# Patient Record
Sex: Male | Born: 1975 | Hispanic: Yes | Marital: Single
Health system: Southern US, Community
[De-identification: ages and names within clinical notes are randomized; demographics above are authoritative.]

---

## 2013-11-01 ENCOUNTER — Emergency Department (HOSPITAL_COMMUNITY): Payer: Self-pay

## 2013-11-01 ENCOUNTER — Emergency Department (HOSPITAL_COMMUNITY)
Admission: EM | Admit: 2013-11-01 | Discharge: 2013-11-01 | Disposition: A | Discharge status: Home / Self Care | Payer: Self-pay | Attending: Emergency Medicine | Admitting: Emergency Medicine

## 2013-11-01 ENCOUNTER — Encounter (HOSPITAL_COMMUNITY): Payer: Self-pay | Admitting: Emergency Medicine

## 2013-11-01 DIAGNOSIS — M549 Dorsalgia, unspecified: Secondary | ICD-10-CM

## 2013-11-01 DIAGNOSIS — R9431 Abnormal electrocardiogram [ECG] [EKG]: Secondary | ICD-10-CM | POA: Insufficient documentation

## 2013-11-01 DIAGNOSIS — M436 Torticollis: Secondary | ICD-10-CM | POA: Insufficient documentation

## 2013-11-01 DIAGNOSIS — R52 Pain, unspecified: Secondary | ICD-10-CM | POA: Insufficient documentation

## 2013-11-01 DIAGNOSIS — M546 Pain in thoracic spine: Secondary | ICD-10-CM | POA: Insufficient documentation

## 2013-11-01 DIAGNOSIS — R079 Chest pain, unspecified: Secondary | ICD-10-CM | POA: Insufficient documentation

## 2013-11-01 DIAGNOSIS — M542 Cervicalgia: Secondary | ICD-10-CM | POA: Insufficient documentation

## 2013-11-01 LAB — CBC
HCT: 44.1 % (ref 39.0–52.0)
HEMOGLOBIN: 15.6 g/dL (ref 13.0–17.0)
MCH: 30.8 pg (ref 26.0–34.0)
MCHC: 35.4 g/dL (ref 30.0–36.0)
MCV: 87.2 fL (ref 78.0–100.0)
Platelets: 176 10*3/uL (ref 150–400)
RBC: 5.06 MIL/uL (ref 4.22–5.81)
RDW: 12.3 % (ref 11.5–15.5)
WBC: 7.3 10*3/uL (ref 4.0–10.5)

## 2013-11-01 LAB — BASIC METABOLIC PANEL
BUN: 15 mg/dL (ref 6–23)
CALCIUM: 9.3 mg/dL (ref 8.4–10.5)
CO2: 21 meq/L (ref 19–32)
CREATININE: 0.81 mg/dL (ref 0.50–1.35)
Chloride: 104 mEq/L (ref 96–112)
GFR calc Af Amer: >90 mL/min (ref >90)
GLUCOSE: 121 mg/dL — AB (ref 70–99)
Potassium: 4.5 mEq/L (ref 3.7–5.3)
Sodium: 140 mEq/L (ref 137–147)

## 2013-11-01 LAB — TROPONIN I: Troponin I: <0.3 ng/mL (ref <0.30)

## 2013-11-01 LAB — I-STAT TROPONIN, ED: Troponin i, poc: 0 ng/mL (ref 0.00–0.08)

## 2013-11-01 LAB — PRO B NATRIURETIC PEPTIDE: Pro B Natriuretic peptide (BNP): 86.4 pg/mL (ref 0–125)

## 2013-11-01 MED ORDER — IBUPROFEN 800 MG PO TABS: 800.0000 mg | ORAL_TABLET | Freq: Three times a day (TID) | ORAL | Status: AC

## 2013-11-01 MED ORDER — HYDROCODONE-ACETAMINOPHEN 5-325 MG PO TABS: 1.0000 | ORAL_TABLET | ORAL | Status: AC | PRN

## 2013-11-01 MED ORDER — OXYCODONE-ACETAMINOPHEN 5-325 MG PO TABS
1.0000 | ORAL_TABLET | Freq: Once | ORAL | Status: AC
  Administered 2013-11-01: 1 via ORAL
  Filled 2013-11-01: qty 1

## 2013-11-01 MED ORDER — CYCLOBENZAPRINE HCL 10 MG PO TABS: 10.0000 mg | ORAL_TABLET | Freq: Two times a day (BID) | ORAL | Status: AC | PRN

## 2013-11-01 NOTE — ED Notes (Addendum)
Wife is present interpreting for pt.  Pt c/o sharp pain in his upper back, between the scapulae.  Pain started today around 1pm while the patient was walking and reports feeling like the "wind was knocked out of him".  Pt reports its a constant sharp pain, exacerbated by back/neck movement.  Pt denies heavy lifting or strenuous activity before onset of pain.  Pt denies any injury or trauma.  Pt reported pain radiated to chest in triage, but no longer is experiencing any chest pain.  Pt reports taking ibuprofen at 1pm which helped the pain.  No acute distress noted.  Skin warm and dry.  Respirations even and unlabored.

## 2013-11-01 NOTE — ED Notes (Signed)
Pt reports walking and sudden onset of upper back pain that will intermittently radiate into chest and pt will feel sob. Denies any injury or hx of back pain. Ambulatory at triage with no acute distress.

## 2013-11-01 NOTE — ED Provider Notes (Signed)
CSN: [id number]     Arrival date & time 11/01/13  1426 History   First MD Initiated Contact with Patient 11/01/13 1557     Chief Complaint  Patient presents with  . Back Pain  . Chest Pain     (Consider location/radiation/quality/duration/timing/severity/associated sxs/prior Treatment) HPI Comments: Stuart SchleinDavid Enriquez Jones is a 38 y.o. male without a significant past medical history, presenting the Emergency Department with a chief complaint of abrupt onset of neck and back pain.  The patient reports while walking he experienced a sharp pain to his upper back and neck.  He reports the pain was so severe that it crippled him, making him fall to the ground.  He reports he has never had similar pain in the past.  He reports taking 400 mg of Ibuprofen and ASA.  He reports partial relief of symptoms.  Aggravating factors included flexion of neck, movement of bilateral upper extremities.   NO PCP   Patient is a 38 y.o. male presenting with back pain and chest pain. The history is provided by the patient and the spouse. A language interpreter was used (family.  Mother and wife.).  Back Pain Associated symptoms: no chest pain, no fever and no headaches   Chest Pain Associated symptoms: back pain   Associated symptoms: no cough, no diaphoresis, no fever, no headache, no palpitations and no shortness of breath     History reviewed. No pertinent past medical history. History reviewed. No pertinent past surgical history. History reviewed. No pertinent family history. History  Substance Use Topics  . Smoking status: Not on file  . Smokeless tobacco: Not on file  . Alcohol Use: No    Review of Systems  Constitutional: Negative for fever, chills and diaphoresis.  Respiratory: Negative for cough, chest tightness, shortness of breath and wheezing.   Cardiovascular: Negative for chest pain, palpitations and leg swelling.  Musculoskeletal: Positive for back pain, neck pain and neck stiffness.  Skin:  Negative for rash and wound.  Neurological: Negative for light-headedness and headaches.      Allergies  Review of patient's allergies indicates no known allergies.  Home Medications   Current Outpatient Rx  Name  Route  Sig  Dispense  Refill  . ibuprofen (ADVIL,MOTRIN) 200 MG tablet   Oral   Take 400 mg by mouth every 6 (six) hours as needed for moderate pain.         . cyclobenzaprine (FLEXERIL) 10 MG tablet   Oral   Take 1 tablet (10 mg total) by mouth 2 (two) times daily as needed for muscle spasms.   10 tablet   0   . HYDROcodone-acetaminophen (NORCO/VICODIN) 5-325 MG per tablet   Oral   Take 1 tablet by mouth every 4 (four) hours as needed.   10 tablet   0   . ibuprofen (ADVIL,MOTRIN) 800 MG tablet   Oral   Take 1 tablet (800 mg total) by mouth 3 (three) times daily.   30 tablet   0    BP 128/91  Pulse 53  Temp(Src) 97.5 F (36.4 C) (Oral)  Resp 16  Wt 220 lb 3 oz (99.876 kg)  SpO2 96% Physical Exam  Nursing note and vitals reviewed. Constitutional: He is oriented to person, place, and time. He appears well-developed and well-nourished. No distress.  HENT:  Head: Normocephalic and atraumatic.  Neck: Normal range of motion. Neck supple. Spinous process tenderness and muscular tenderness present.  Full active ROM of C-spine.  Cardiovascular: Normal rate and  regular rhythm.   Pulses:      Radial pulses are 2+ on the right side, and 2+ on the left side.  Pulmonary/Chest: Not tachypneic. He has no decreased breath sounds. He has no wheezes. He has no rhonchi. He has no rales.  Abdominal: Soft. There is no tenderness. There is no rebound and no guarding.  Musculoskeletal:       Right shoulder: He exhibits normal range of motion.       Left shoulder: He exhibits normal range of motion.       Cervical back: He exhibits tenderness, bony tenderness and spasm. He exhibits normal range of motion.       Back:  Mild mid line tenderness to palpation of the  C-spine and T-spine.  Increase to paravertebral soft tissue.  Discomfort recreated with palpation and movement of right and left upper extremitites.  Lymphadenopathy:       Head (right side): No submental, no submandibular, no tonsillar, no preauricular, no posterior auricular and no occipital adenopathy present.       Head (left side): No submental, no submandibular, no tonsillar, no preauricular, no posterior auricular and no occipital adenopathy present.    He has no cervical adenopathy.  Neurological: He is alert and oriented to person, place, and time. No sensory deficit. He exhibits normal muscle tone. Coordination normal.  Reflex Scores:      Bicep reflexes are 1+ on the right side and 1+ on the left side. Skin: Skin is warm and dry. No rash noted. He is not diaphoretic.    ED Course  Procedures (including critical care time) Labs Review Labs Reviewed  BASIC METABOLIC PANEL - Abnormal; Notable for the following:    Glucose, Bld 121 (*)    All other components within normal limits  CBC  PRO B NATRIURETIC PEPTIDE  TROPONIN I  I-STAT TROPOININ, ED   Imaging Review Dg Chest 2 View  11/01/2013   CLINICAL DATA:  Upper chest and back pain  EXAM: CHEST  2 VIEW  COMPARISON:  None.  FINDINGS: The lungs are adequately inflated and clear. The cardiopericardial silhouette is normal in size. The pulmonary vascularity is not engorged. There is no pleural effusion or pneumothorax or pneumomediastinum. The trachea is midline. The mediastinum is normal in width. The observed portions of the bony thorax exhibit no acute abnormalities.  IMPRESSION: There is no evidence of active cardiopulmonary disease.   Electronically Signed   By: Livan  [place]   On: 11/01/2013 15:46   Dg Cervical Spine Complete  11/01/2013   CLINICAL DATA:  Chest pain, back pain.  EXAM: CERVICAL SPINE  4+ VIEWS  COMPARISON:  None.  FINDINGS: There is no evidence of cervical spine fracture or prevertebral soft tissue swelling.  Alignment is normal. No other significant bone abnormalities are identified.  IMPRESSION: Negative cervical spine radiographs.   Electronically Signed   By: Charlett Nose M.D.   On: 11/01/2013 17:33   Dg Thoracic Spine 2 View  11/01/2013   CLINICAL DATA:  Midline back pain.  EXAM: THORACIC SPINE - 2 VIEW  COMPARISON:  Chest x-ray earlier today.  FINDINGS: Slight leftward scoliosis in the upper thoracic spine. No fracture or subluxation. Disc spaces are maintained.  IMPRESSION: No acute bony abnormality.   Electronically Signed   By: Charlett Nose M.D.   On: 11/01/2013 17:33     EKG Interpretation   Date/Time:  Sunday November 01 2013 13:40:49 EDT Ventricular Rate:  58 PR Interval:  160  QRS Duration: 108 QT Interval:  398 QTC Calculation: 390 R Axis:   -38 Text Interpretation:  Sinus bradycardia Left axis deviation Inferior  infarct , age undetermined No previous ECGs available Confirmed by [facility]   MD, Jonny Ruiz ([id number]) on 11/01/2013 4:48:29 PM      MDM   Final diagnoses:  Neck pain, acute  Upper back pain  Abnormal EKG   Pt with an abrupt onset of upper back and neck pain.  He denies chest pain.  Discomfort recreated with movement of upper extremities and palpation, no injury. Chest XR without acute findings. Troponin negative, EKG with inferior infarct.cbc-WNL, BMP-without concerning abnormalities.   C spine and T spine XR without acute findings. Resting comfortably discussed findings with pt's wife and will repeat troponin. Troponin x2 negative. Discussed patient history, condition, and labs with Dr. Fonnie Jarvis who agrees the patient can be evaluated as an out-pt. Re-eval patient resting comfortably in room, reports back discomfort 3/10.  Discussed lab results, imaging results, and treatment plan (NSAIDs and Ice encouraged) with the patient and patient's family. referral to cardiology given. Return precautions given. Reports understanding and no other concerns at this time.  Patient is stable for  discharge at this time. Meds given in ED:  Medications  oxyCODONE-acetaminophen (PERCOCET/ROXICET) 5-325 MG per tablet 1 tablet (1 tablet Oral Given 11/01/13 1656)    Discharge Medication List as of 11/01/2013  7:29 PM    START taking these medications   Details  cyclobenzaprine (FLEXERIL) 10 MG tablet Take 1 tablet (10 mg total) by mouth 2 (two) times daily as needed for muscle spasms., Starting 11/01/2013, Until Discontinued, Print    HYDROcodone-acetaminophen (NORCO/VICODIN) 5-325 MG per tablet Take 1 tablet by mouth every 4 (four) hours as needed., Starting 11/01/2013, Until Discontinued, Print    !! ibuprofen (ADVIL,MOTRIN) 800 MG tablet Take 1 tablet (800 mg total) by mouth 3 (three) times daily., Starting 11/01/2013, Until Discontinued, Print     !! - Potential duplicate medications found. Please discuss with provider.         Clabe Seal, PA-C 11/03/13 [phone]

## 2013-11-01 NOTE — Discharge Instructions (Signed)
Call for a follow up appointment with a Family or Primary Care Provider.  [facility] for your abnormal [facility]. Return if Symptoms worsen.   Take medication as prescribed.  Ice your back and neck 3-4 times a day.

## 2013-11-02 NOTE — ED Provider Notes (Signed)
Medical screening examination/treatment/procedure(s) were performed by non-physician practitioner and as supervising physician I was immediately available for consultation/collaboration.   EKG Interpretation   Date/Time:  Sunday November 01 2013 13:40:49 EDT Ventricular Rate:  58 PR Interval:  160 QRS Duration: 108 QT Interval:  398 QTC Calculation: 390 R Axis:   -38 Text Interpretation:  Sinus bradycardia Left axis deviation Inferior  infarct , age undetermined No previous ECGs available Conf[facility] by [email or web address]   MD, Jonny RuizJOHN ([id number]) on 11/01/2013 4:48:29 PM       Stuart HornJohn M Taela Charbonneau, MD 11/02/13 [phone]

## 2013-11-13 NOTE — ED Provider Notes (Signed)
Medical screening examination/treatment/procedure(s) were performed by non-physician practitioner and as supervising physician I was immediately available for consultation/collaboration.   EKG Interpretation None       Hurman HornJohn M Edwardo Wojnarowski, MD 11/13/13 [phone]

## 2015-01-09 IMAGING — CR DG CHEST 2V
2 series · 2 of 2 positions shown · non-contrast
Comparison: None.

CLINICAL DATA: Upper chest and back pain

EXAM:
CHEST  2 VIEW

[w chest pa]
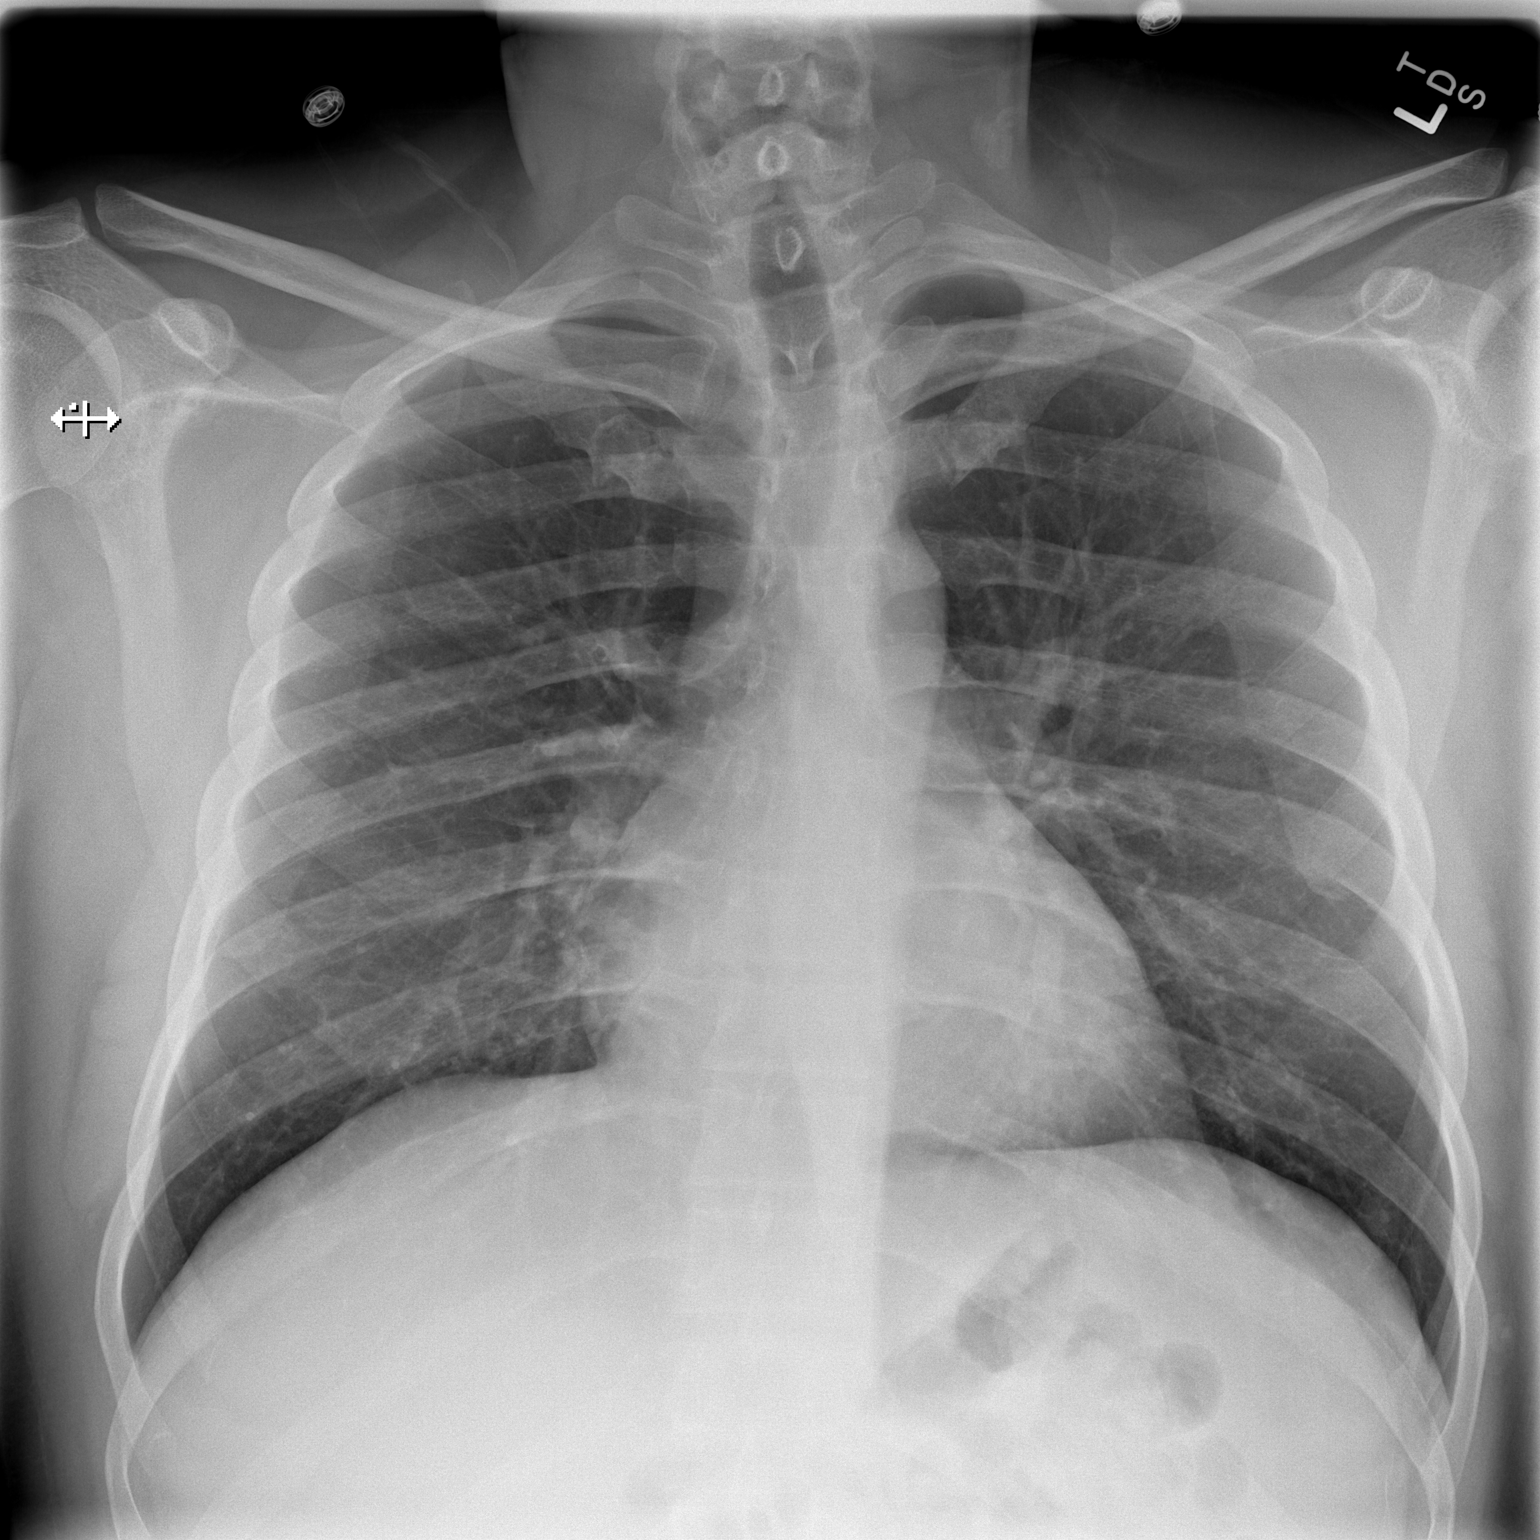

[w chest lat]
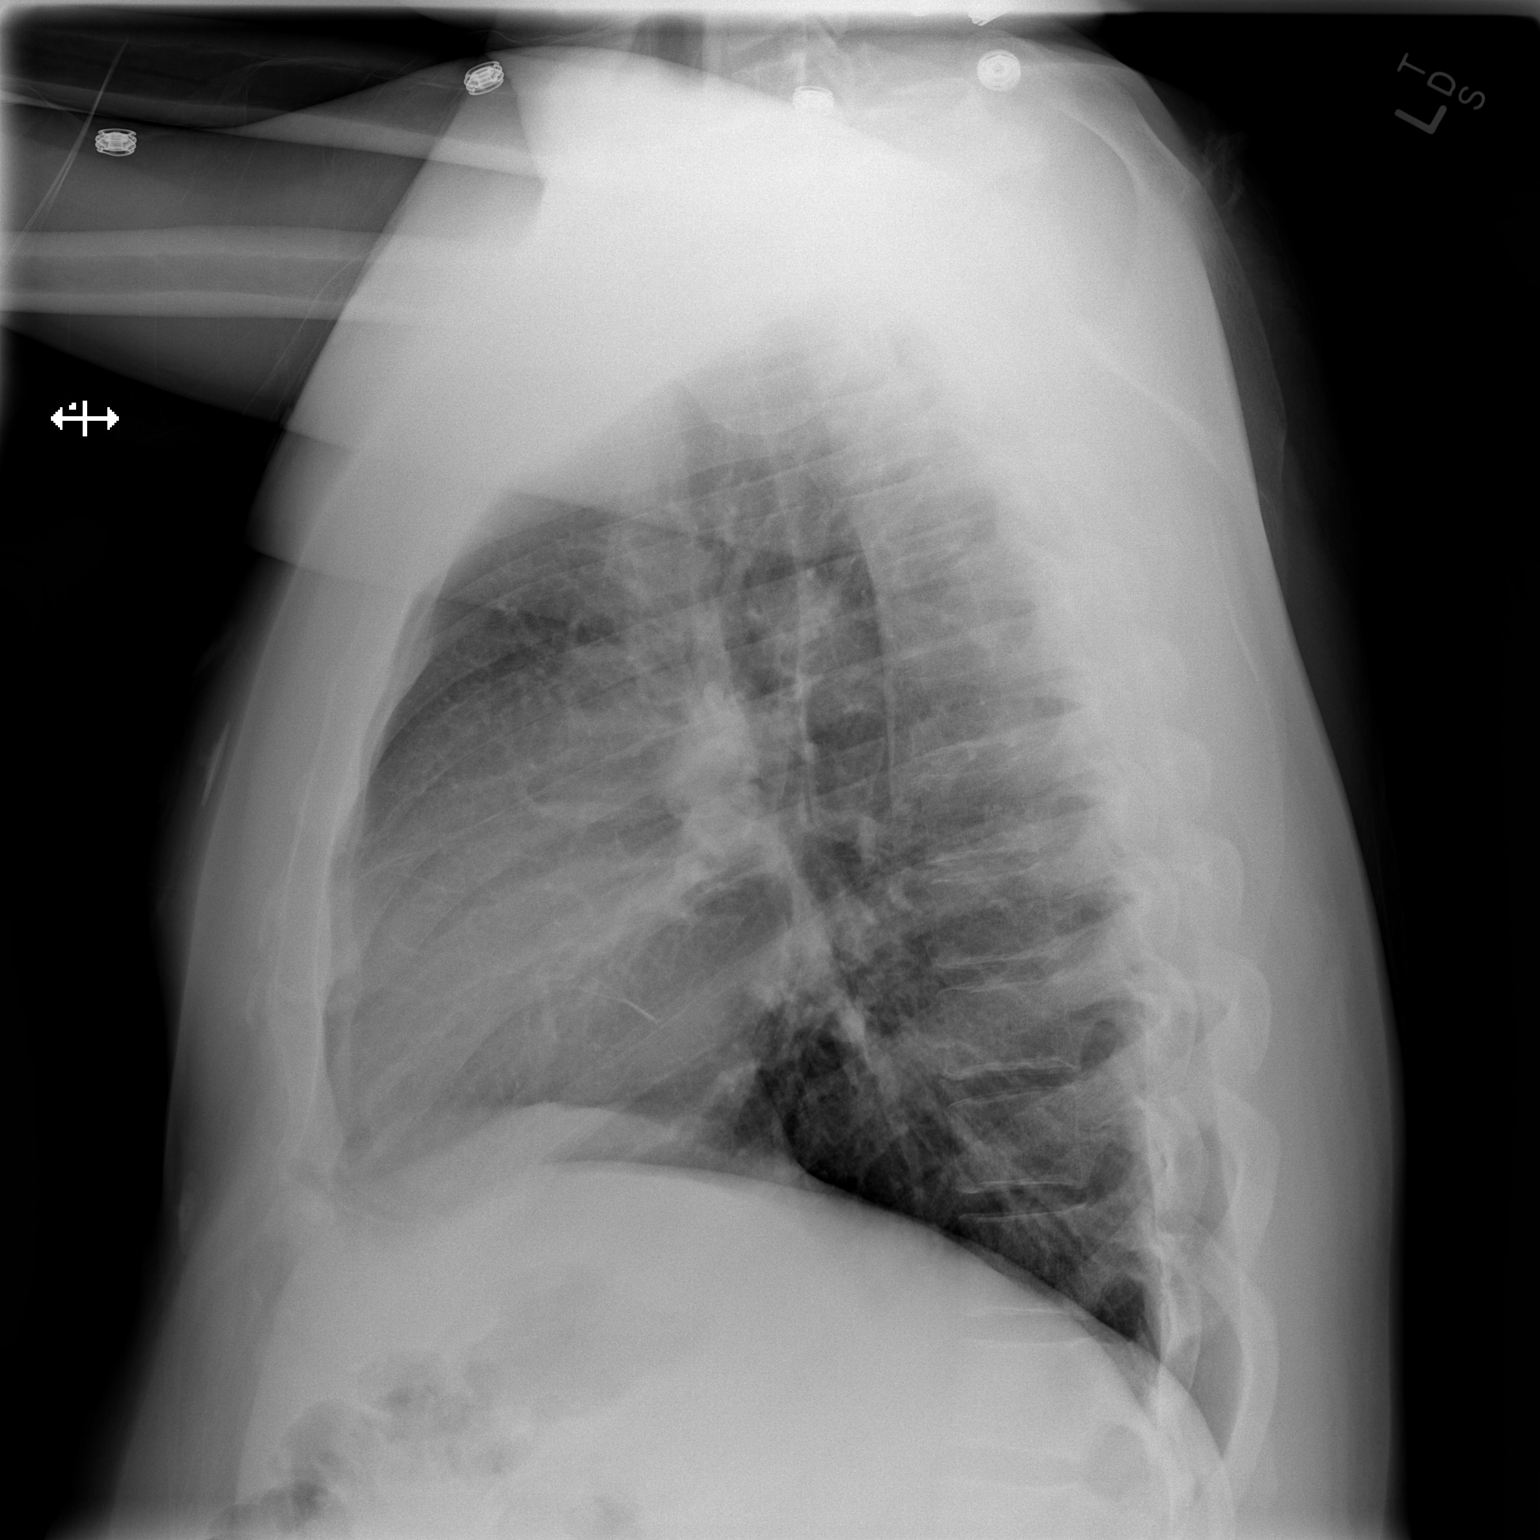

[2 of 2 positions shown; findings below may reference images not displayed]

FINDINGS: The lungs are adequately inflated and clear. The cardiopericardial
silhouette is normal in size. The pulmonary vascularity is not
engorged. There is no pleural effusion or pneumothorax or
pneumomediastinum. The trachea is midline. The mediastinum is normal
in width. The observed portions of the bony thorax exhibit no acute
abnormalities.
IMPRESSION: There is no evidence of active cardiopulmonary disease.

## 2019-03-16 ENCOUNTER — Ambulatory Visit: Payer: Self-pay | Admitting: Internal Medicine

## 2023-10-30 ENCOUNTER — Encounter (HOSPITAL_COMMUNITY): Payer: Self-pay

## 2023-10-30 ENCOUNTER — Emergency Department (HOSPITAL_COMMUNITY)
Admission: EM | Admit: 2023-10-30 | Discharge: 2023-10-30 | Disposition: A | Discharge status: Home / Self Care | Payer: Self-pay | Attending: Emergency Medicine | Admitting: Emergency Medicine

## 2023-10-30 ENCOUNTER — Emergency Department (HOSPITAL_COMMUNITY): Payer: Self-pay

## 2023-10-30 DIAGNOSIS — R079 Chest pain, unspecified: Secondary | ICD-10-CM

## 2023-10-30 DIAGNOSIS — R0789 Other chest pain: Secondary | ICD-10-CM | POA: Insufficient documentation

## 2023-10-30 LAB — CBC
HCT: 43.9 % (ref 39.0–52.0)
Hemoglobin: 14.9 g/dL (ref 13.0–17.0)
MCH: 29.5 pg (ref 26.0–34.0)
MCHC: 33.9 g/dL (ref 30.0–36.0)
MCV: 86.9 fL (ref 80.0–100.0)
Platelets: 201 10*3/uL (ref 150–400)
RBC: 5.05 MIL/uL (ref 4.22–5.81)
RDW: 12.2 % (ref 11.5–15.5)
WBC: 11 10*3/uL — ABNORMAL HIGH (ref 4.0–10.5)
nRBC: 0 % (ref 0.0–0.2)

## 2023-10-30 LAB — BASIC METABOLIC PANEL
Anion gap: 11 (ref 5–15)
BUN: 15 mg/dL (ref 6–20)
CO2: 23 mmol/L (ref 22–32)
Calcium: 9.1 mg/dL (ref 8.9–10.3)
Chloride: 104 mmol/L (ref 98–111)
Creatinine, Ser: 1.1 mg/dL (ref 0.61–1.24)
GFR, Estimated: >60 mL/min (ref >60)
Glucose, Bld: 201 mg/dL — ABNORMAL HIGH (ref 70–99)
Potassium: 4.2 mmol/L (ref 3.5–5.1)
Sodium: 138 mmol/L (ref 135–145)

## 2023-10-30 LAB — TROPONIN I (HIGH SENSITIVITY)
Troponin I (High Sensitivity): 6 ng/L (ref <18)
Troponin I (High Sensitivity): 10 ng/L (ref <18)

## 2023-10-30 NOTE — Discharge Instructions (Addendum)
 PLEASE CALL PCP TO ESTABLISH CARE  POR FAVOR LLAME AL PCP PARA ESTABLECER ATENCIN      Return to the Emergency Department if you have unusual chest pain, pressure, or discomfort, shortness of breath, nausea, vomiting, burping, heartburn, tingling upper body parts, sweating, cold, clammy skin, or racing heartbeat. Call 911 if you think you are having a heart attack. Follow cardiac diet - avoid fatty & fried foods, don't eat too much red meat, eat lots of fruits & vegetables, and dairy products should be low fat. Please lose weight if you are overweight. Become more active with walking, gardening, or any other activity that gets you to moving.   Please return to the emergency department immediately for any new or concerning symptoms, or if you get worse.    Regrese al [employer] de Emergencias si tiene Engineer, mining, presin o Dentist inusual en el pecho, dificultad para respirar, nuseas, vmitos, eructos, acidez estomacal, hormigueo en la parte superior del cuerpo, sudoracin, piel fra y hmeda o ritmo cardaco acelerado. Llame al 911 si cree que est sufriendo un ataque cardaco. Siga una dieta cardaca: evite los alimentos grasosos y fritos, no coma demasiada carne roja, coma muchas frutas y verduras, y los productos lcteos deben ser bajos en grasa. Baje de peso si tiene sobrepeso. Sea ms [place], haciendo [place] o cualquier otra actividad que lo [employer].   Regrese al departamento de emergencias de inmediato si presenta sntomas nuevos o preocupantes, o si empeora.

## 2023-10-30 NOTE — ED Triage Notes (Signed)
 Pt BIB EMS with c/o 10/10 CP that started aprox 1hr prior to arrival. Pt was doing yard work when it started, chest pain/pressure from center to L chest radiating up L neck. No cardiac hx. Room air sats initially 88% for EMS. Now 94% on room air. Pt now rates pain as 2/10.EMS gave 324mg  asa and 500cc NS.

## 2023-10-30 NOTE — ED Provider Notes (Signed)
 [place] EMERGENCY DEPARTMENT AT [facility] Provider Note  CSN: [id number] Arrival date & time: 10/30/23 1646  Chief Complaint(s) Chest Pain  HPI Stuart Jones is a 48 y.o. male with past medical history as below, significant for no significant medical history but does not seek regular primary care who presents to the ED with complaint of chest pain  Patient is here with spouse, reports patient was having chest pain around 2 PM today after having argument with family member.  Prior to the argument patient reports he is feeling normal.  He said intermittent episodes similar to this associate with anxiety in the past over the past 15 years.  He is currently asymptomatic.  Symptoms lasted for around 20-30 minutes.  He was initially hypoxic but this has since resolved without acute intervention.  Denies use of stimulants or illicit drugs, no recent falls or injuries, does not follow with primary care.  No nausea, vomiting, syncope.  No numbness, tingling or weakness to extremities  Past Medical History History reviewed. No pertinent past medical history. There are no active problems to display for this patient.  Home Medication(s) Prior to Admission medications   Medication Sig Start Date End Date Taking? Authorizing Provider  cyclobenzaprine (FLEXERIL) 10 MG tablet Take 1 tablet (10 mg total) by mouth 2 (two) times daily as needed for muscle spasms. 11/01/13   Mellody Drown, PA-C  HYDROcodone-acetaminophen (NORCO/VICODIN) 5-325 MG per tablet Take 1 tablet by mouth every 4 (four) hours as needed. 11/01/13   Mellody Drown, PA-C  ibuprofen (ADVIL,MOTRIN) 200 MG tablet Take 400 mg by mouth every 6 (six) hours as needed for moderate pain.    [provider]  ibuprofen (ADVIL,MOTRIN) 800 MG tablet Take 1 tablet (800 mg total) by mouth 3 (three) times daily. 11/01/13   Mellody Drown, PA-C                                                                                                                                     Past Surgical History History reviewed. No pertinent surgical history. Family History History reviewed. No pertinent family history.  Social History Social History   Tobacco Use   Smoking status: Never  Substance Use Topics   Alcohol use: No   Drug use: No   Allergies Patient has no known allergies.  Review of Systems A thorough review of systems was obtained and all systems are negative except as noted in the HPI and PMH.   Physical Exam Vital Signs  I have reviewed the triage vital signs BP (!) 130/93 (BP Location: Right Arm)   Pulse 64   Temp (!) 97.5 F (36.4 C) (Oral)   Resp 14   Ht 5\' 10"  (1.778 m)   Wt 95.3 kg   SpO2 98%   BMI 30.13 kg/m  Physical Exam Vitals and nursing note reviewed.  Constitutional:      General: He is  not in acute distress.    Appearance: He is well-developed.  HENT:     Head: Normocephalic and atraumatic.     Right Ear: External ear normal.     Left Ear: External ear normal.     Mouth/Throat:     Mouth: Mucous membranes are moist.  Eyes:     General: No scleral icterus. Cardiovascular:     Rate and Rhythm: Normal rate and regular rhythm.     Pulses: Normal pulses.     Heart sounds: Normal heart sounds. No murmur heard. Pulmonary:     Effort: Pulmonary effort is normal. No respiratory distress.     Breath sounds: Normal breath sounds.  Abdominal:     General: Abdomen is flat.     Palpations: Abdomen is soft.     Tenderness: There is no abdominal tenderness.  Musculoskeletal:     Cervical back: No rigidity.     Right lower leg: No edema.     Left lower leg: No edema.  Skin:    General: Skin is warm and dry.     Capillary Refill: Capillary refill takes less than 2 seconds.  Neurological:     Mental Status: He is alert.  Psychiatric:        Mood and Affect: Mood normal.        Behavior: Behavior normal.     ED Results and Treatments Labs (all labs ordered are listed, but only  abnormal results are displayed) Labs Reviewed  BASIC METABOLIC PANEL - Abnormal; Notable for the following components:      Result Value   Glucose, Bld 201 (*)    All other components within normal limits  CBC - Abnormal; Notable for the following components:   WBC 11.0 (*)    All other components within normal limits  TROPONIN I (HIGH SENSITIVITY)  TROPONIN I (HIGH SENSITIVITY)                                                                                                                          Radiology DG Chest 2 View Result Date: 10/30/2023 CLINICAL DATA:  Left-sided chest pain. EXAM: CHEST - 2 VIEW COMPARISON:  11/01/2013 FINDINGS: The heart size and mediastinal contours are within normal limits. Both lungs are clear. The visualized skeletal structures are unremarkable. IMPRESSION: No active cardiopulmonary disease. Electronically Signed   By: Danae Orleans M.D.   On: 10/30/2023 20:03    Pertinent labs & imaging results that were available during my care of the patient were reviewed by me and considered in my medical decision making (see MDM for details).  Medications Ordered in ED Medications - No data to display  Procedures Procedures  (including critical care time)  Medical Decision Making / ED Course    Medical Decision Making:    Stuart Jones is a 48 y.o. male with past medical history as below, significant for no significant medical history but does not seek regular primary care who presents to the ED with complaint of chest pain. The complaint involves an extensive differential diagnosis and also carries with it a high risk of complications and morbidity.  Serious etiology was considered. Ddx includes but is not limited to: Differential includes all life-threatening causes for chest pain. This includes but is not exclusive to acute  coronary syndrome, aortic dissection, pulmonary embolism, cardiac tamponade, community-acquired pneumonia, pericarditis, musculoskeletal chest wall pain, etc.   Complete initial physical exam performed, notably the patient was in no distress, currently symptomatic.    Reviewed and confirmed nursing documentation for past medical history, family history, social history.  Vital signs reviewed.      Brief summary: 48 year old male with chest pain prior to arrival that is since resolved.  Symptoms began following argument with family member.  He is currently symptom-free. Labs reviewed, these are stable, EKG without acute ischemic abnormalities.  Chest x-ray unremarkable.  The patient's chest pain is not suggestive of pulmonary embolus, cardiac ischemia, aortic dissection, pericarditis, myocarditis, pulmonary embolism, pneumothorax, pneumonia, Zoster, or esophageal perforation, or other serious etiology.  Historically not abrupt in onset, tearing or ripping, pulses symmetric. EKG nonspecific for ischemia/infarction. No dysrhythmias, brugada, WPW, prolonged QT noted.   Troponin negative x2. CXR reviewed. Labs without demonstration of acute pathology unless otherwise noted above. Low HEART Score: 0-3 points (0.9-1.7% risk of MACE).  Symptoms have resolved, he is back to normal.  Recommend patient follow-up with primary care, given local options for follow-up  Given the extremely low risk of these diagnoses further testing and evaluation for these possibilities does not appear to be indicated at this time. Patient in no distress and overall condition improved here in the ED. Detailed discussions were had with the patient regarding current findings, and need for close f/u with PCP or on call doctor. The patient has been instructed to return immediately if the symptoms worsen in any way for re-evaluation. Patient verbalized understanding and is in agreement with current care plan. All questions answered  prior to discharge.                 Additional history obtained: -Additional history obtained from family -External records from outside source obtained and reviewed including: Chart review including previous notes, labs, imaging, consultation notes including  Prior ED evaluation   Lab Tests: -I ordered, reviewed, and interpreted labs.   The pertinent results include:   Labs Reviewed  BASIC METABOLIC PANEL - Abnormal; Notable for the following components:      Result Value   Glucose, Bld 201 (*)    All other components within normal limits  CBC - Abnormal; Notable for the following components:   WBC 11.0 (*)    All other components within normal limits  TROPONIN I (HIGH SENSITIVITY)  TROPONIN I (HIGH SENSITIVITY)    Notable for labs are stable  EKG   EKG Interpretation Date/Time:  Wednesday October 30 2023 16:49:56 EST Ventricular Rate:  69 PR Interval:  167 QRS Duration:  112 QT Interval:  371 QTC Calculation: 398 R Axis:   115  Text Interpretation: Sinus rhythm Borderline intraventricular conduction delay Confirmed by Tanda Rockers ([id number]) on 10/30/2023 5:19:11 PM  Imaging Studies ordered: I ordered imaging studies including CXR I independently visualized the following imaging with scope of interpretation limited to determining acute life threatening conditions related to emergency care; findings noted above I independently visualized and interpreted imaging. I agree with the radiologist interpretation   Medicines ordered and prescription drug management: No orders of the defined types were placed in this encounter.   -I have reviewed the patients home medicines and have made adjustments as needed   Consultations Obtained: na   Cardiac Monitoring: The patient was maintained on a cardiac monitor.  I personally viewed and interpreted the cardiac monitored which showed an underlying rhythm of: NSR Continuous pulse oximetry interpreted by  myself, 100% on RA.    Social Determinants of Health:  Diagnosis or treatment significantly limited by social determinants of health: no pcp   Reevaluation: After the interventions noted above, I reevaluated the patient and found that they have resolved  Co morbidities that complicate the patient evaluation History reviewed. No pertinent past medical history.    Dispostion: Disposition decision including need for hospitalization was considered, and patient discharged from emergency department.    Final Clinical Impression(s) / ED Diagnoses Final diagnoses:  Chest pain with low risk of acute coronary syndrome        Sloan Leiter, DO 10/30/23 2222
